# Patient Record
Sex: Female | Born: 1980 | Hispanic: No | Marital: Married | Smoking: Never smoker
Health system: Southern US, Community
[De-identification: ages and names within clinical notes are randomized; demographics above are authoritative.]

## PROBLEM LIST (undated history)

## (undated) ENCOUNTER — Inpatient Hospital Stay (HOSPITAL_COMMUNITY): Payer: Self-pay

## (undated) DIAGNOSIS — Z3490 Encounter for supervision of normal pregnancy, unspecified, unspecified trimester: Secondary | ICD-10-CM

## (undated) DIAGNOSIS — B999 Unspecified infectious disease: Secondary | ICD-10-CM

## (undated) DIAGNOSIS — N83209 Unspecified ovarian cyst, unspecified side: Secondary | ICD-10-CM

## (undated) HISTORY — PX: NO PAST SURGERIES: SHX2092

---

## 2007-09-12 ENCOUNTER — Other Ambulatory Visit: Admission: RE | Admit: 2007-09-12 | Discharge: 2007-09-12 | Payer: Self-pay | Admitting: Family Medicine

## 2008-09-22 ENCOUNTER — Emergency Department (HOSPITAL_COMMUNITY): Admission: EM | Admit: 2008-09-22 | Discharge: 2008-09-22 | Payer: Self-pay | Admitting: Emergency Medicine

## 2009-02-11 ENCOUNTER — Emergency Department (HOSPITAL_BASED_OUTPATIENT_CLINIC_OR_DEPARTMENT_OTHER): Admission: EM | Admit: 2009-02-11 | Discharge: 2009-02-11 | Payer: Self-pay | Admitting: Emergency Medicine

## 2011-01-10 LAB — COMPREHENSIVE METABOLIC PANEL
ALT: 8 U/L (ref 0–35)
Alkaline Phosphatase: 52 U/L (ref 39–117)
CO2: 26 mEq/L (ref 19–32)
Calcium: 8.4 mg/dL (ref 8.4–10.5)
GFR calc non Af Amer: >60 mL/min (ref >60)
Glucose, Bld: 167 mg/dL — ABNORMAL HIGH (ref 70–99)
Potassium: 3.9 mEq/L (ref 3.5–5.1)
Sodium: 142 mEq/L (ref 135–145)
Total Bilirubin: 0.4 mg/dL (ref 0.3–1.2)

## 2011-01-10 LAB — DIFFERENTIAL
Basophils Absolute: 0 10*3/uL (ref 0.0–0.1)
Basophils Relative: 0 % (ref 0–1)
Eosinophils Absolute: 0.1 10*3/uL (ref 0.0–0.7)
Neutrophils Relative %: 89 % — ABNORMAL HIGH (ref 43–77)

## 2011-01-10 LAB — URINALYSIS, ROUTINE W REFLEX MICROSCOPIC
Ketones, ur: 15 mg/dL — AB
Nitrite: NEGATIVE
Urobilinogen, UA: 1 mg/dL (ref 0.0–1.0)

## 2011-01-10 LAB — CBC
Hemoglobin: 11.1 g/dL — ABNORMAL LOW (ref 12.0–15.0)
MCHC: 33.8 g/dL (ref 30.0–36.0)
RBC: 3.7 MIL/uL — ABNORMAL LOW (ref 3.87–5.11)

## 2011-01-10 LAB — PREGNANCY, URINE: Preg Test, Ur: NEGATIVE

## 2011-01-10 LAB — LIPASE, BLOOD: Lipase: 41 U/L (ref 23–300)

## 2011-01-10 LAB — URINE MICROSCOPIC-ADD ON

## 2011-09-19 ENCOUNTER — Other Ambulatory Visit: Payer: Self-pay | Admitting: Family Medicine

## 2011-09-19 DIAGNOSIS — N63 Unspecified lump in unspecified breast: Secondary | ICD-10-CM

## 2011-09-19 DIAGNOSIS — N644 Mastodynia: Secondary | ICD-10-CM

## 2011-09-21 ENCOUNTER — Other Ambulatory Visit: Payer: Self-pay

## 2011-09-27 ENCOUNTER — Ambulatory Visit
Admission: RE | Admit: 2011-09-27 | Discharge: 2011-09-27 | Disposition: A | Discharge status: Home / Self Care | Payer: Self-pay | Source: Ambulatory Visit | Attending: Family Medicine | Admitting: Family Medicine

## 2011-09-27 DIAGNOSIS — N644 Mastodynia: Secondary | ICD-10-CM

## 2011-09-27 DIAGNOSIS — N63 Unspecified lump in unspecified breast: Secondary | ICD-10-CM

## 2013-09-20 ENCOUNTER — Inpatient Hospital Stay (HOSPITAL_BASED_OUTPATIENT_CLINIC_OR_DEPARTMENT_OTHER)
Admission: AD | Admit: 2013-09-20 | Discharge: 2013-09-20 | Disposition: A | Discharge status: Other Acute Inpatient Hospital | Payer: BC Managed Care – PPO | Source: Ambulatory Visit | Attending: Family Medicine | Admitting: Family Medicine

## 2013-09-20 ENCOUNTER — Inpatient Hospital Stay (HOSPITAL_COMMUNITY): Payer: BC Managed Care – PPO

## 2013-09-20 ENCOUNTER — Encounter (HOSPITAL_BASED_OUTPATIENT_CLINIC_OR_DEPARTMENT_OTHER): Payer: Self-pay | Admitting: Emergency Medicine

## 2013-09-20 DIAGNOSIS — O9989 Other specified diseases and conditions complicating pregnancy, childbirth and the puerperium: Secondary | ICD-10-CM | POA: Insufficient documentation

## 2013-09-20 DIAGNOSIS — R109 Unspecified abdominal pain: Secondary | ICD-10-CM | POA: Insufficient documentation

## 2013-09-20 DIAGNOSIS — O3680X1 Pregnancy with inconclusive fetal viability, fetus 1: Secondary | ICD-10-CM

## 2013-09-20 DIAGNOSIS — O200 Threatened abortion: Secondary | ICD-10-CM

## 2013-09-20 HISTORY — DX: Encounter for supervision of normal pregnancy, unspecified, unspecified trimester: Z34.90

## 2013-09-20 LAB — URINALYSIS, ROUTINE W REFLEX MICROSCOPIC
Glucose, UA: NEGATIVE mg/dL
Leukocytes, UA: NEGATIVE
Specific Gravity, Urine: 1.019 (ref 1.005–1.030)
Urobilinogen, UA: 0.2 mg/dL (ref 0.0–1.0)

## 2013-09-20 LAB — URINE MICROSCOPIC-ADD ON

## 2013-09-20 LAB — CBC
HCT: 40.8 % (ref 36.0–46.0)
Hemoglobin: 14.4 g/dL (ref 12.0–15.0)
MCV: 86.3 fL (ref 78.0–100.0)
RDW: 12.8 % (ref 11.5–15.5)
WBC: 9.6 10*3/uL (ref 4.0–10.5)

## 2013-09-20 LAB — WET PREP, GENITAL: Clue Cells Wet Prep HPF POC: NONE SEEN

## 2013-09-20 LAB — PREGNANCY, URINE: Preg Test, Ur: POSITIVE — AB

## 2013-09-20 NOTE — MAU Provider Note (Signed)
  History     CSN: [id number]  Arrival date and time: 09/20/13 1043   None     Chief Complaint  Patient presents with  . Vaginal Bleeding  . pregnant    HPI Melissa Peck is a 32 y.o. G2P1001 at [redacted]w[redacted]d presented to the ED for complaints of vaginal bleeding. Pt with positive pregnancy test and transved via POV to MAU for further evaluation. Pt states that over the last week she has had brown discharge similar to first pregnancy. Pt this monring started having Bright red blood from her vagina and vaginal cramping. Pt states that she has otherwise in her usual state of health.  +Cramping, +VB, no lof  OB History   Grav Para Term Preterm Abortions TAB SAB Ect Mult Living   2 1 1       1       Past Medical History  Diagnosis Date  . Pregnant     History reviewed. No pertinent past surgical history.  History reviewed. No pertinent family history.  History  Substance Use Topics  . Smoking status: Never Smoker   . Smokeless tobacco: Not on file  . Alcohol Use: No    Allergies: No Known Allergies  No prescriptions prior to admission    ROS No F/C, SOB, N/V, d/c, no headaches, Upper resp sx, or other complaints.  Physical Exam   Blood pressure 125/84, pulse 104, temperature 98.3 F (36.8 C), temperature source Oral, resp. rate 18, height 5\' 3"  (1.6 m), weight 51.256 kg (113 lb), last menstrual period 08/09/2013, SpO2 99.00%.  Physical Exam VSS NAD RRR no mgt Normal abdominal exam except for suprapubic tenderness.  No c/c/e  SSE: Signficant blood in vault with dark red clots and black clots.  Os with bloody discharge. Results for JENNESSA, TRIGO (MRN [id number]) as of 09/20/2013 16:55  Ref. Range 09/20/2013 12:30  hCG, Beta Chain, Quant, S Latest Range: <5 mIU/mL 2044 (H)  WBC Latest Range: 4.0-10.5 K/uL 9.6  RBC Latest Range: 3.87-5.11 MIL/uL 4.73  Hemoglobin Latest Range: 12.0-15.0 g/dL 30.8  HCT Latest Range: 36.0-46.0 % 40.8  MCV Latest Range: 78.0-100.0 fL 86.3   MCH Latest Range: 26.0-34.0 pg 30.4  MCHC Latest Range: 30.0-36.0 g/dL 65.7  RDW Latest Range: 11.5-15.5 % 12.8  Platelets Latest Range: 150-400 K/uL 186   IMPRESSION:  No evidence of intrauterine pregnancy. Heterogeneous material within  the lower uterine canal may represent clot. This may represent a  early or nonviable early pregnancy. Recommend follow-up quantitative  B-HCG levels and follow-up US in 14 days to confirm/assess  viability. This recommendation follows SRU consensus guidelines:  Diagnostic Criteria for Nonviable Pregnancy Early in the First  Trimester. Malva Limes Med 2013; 846:9629-52.  MAU Course  Procedures  MDM Will further eval with Transvaginal US to rule out ectopic  Assessment and Plan  Melissa Peck is a 32 y.o. G2P1001 at [redacted]w[redacted]d likely with incomplete miscarriage. Pt is actively bleeding and will need repeat quant in ~48 hrs. Pt is going out of town, given strict precautions for ER visit and recommend follow up here as soon as she returns for reevaluation. Labs and imaging report provided to patient. Pt states understanding.  Tawana Scale, MD OB Fellow   Cinque Begley, Audie Clear 09/20/2013, 4:52 PM

## 2013-09-20 NOTE — ED Provider Notes (Addendum)
CSN: [id number]     Arrival date & time 09/20/13  1043 History   First MD Initiated Contact with Patient 09/20/13 1154     Chief Complaint  Patient presents with  . Vaginal Bleeding  . pregnant    (Consider location/radiation/quality/duration/timing/severity/associated sxs/prior Treatment) HPI Comments: Patient presents with vaginal bleeding. Her last menstrual period was November 8. She woke up this morning with some lower abdominal cramping and bleeding. She's been passing some blood clots. She does have cough and cold symptoms have been going on for the last few days but denies any fevers. She denies any significant dizziness. She currently denies any abdominal pain. This is her second pregnancy. She has one child.  G2P1  Patient is a 32 y.o. female presenting with vaginal bleeding.  Vaginal Bleeding Associated symptoms: abdominal pain (cramping)   Associated symptoms: no back pain, no dizziness, no fatigue, no fever, no nausea and no vaginal discharge     Past Medical History  Diagnosis Date  . Pregnant    History reviewed. No pertinent past surgical history. No family history on file. History  Substance Use Topics  . Smoking status: Never Smoker   . Smokeless tobacco: Not on file  . Alcohol Use: Not on file   OB History   Grav Para Term Preterm Abortions TAB SAB Ect Mult Living   1              Review of Systems  Constitutional: Negative for fever, chills, diaphoresis and fatigue.  HENT: Negative for congestion, rhinorrhea and sneezing.   Eyes: Negative.   Respiratory: Negative for cough, chest tightness and shortness of breath.   Cardiovascular: Negative for chest pain and leg swelling.  Gastrointestinal: Positive for abdominal pain (cramping). Negative for nausea, vomiting, diarrhea and blood in stool.  Genitourinary: Positive for vaginal bleeding. Negative for frequency, hematuria, flank pain, vaginal discharge and difficulty urinating.  Musculoskeletal: Negative  for arthralgias and back pain.  Skin: Negative for rash.  Neurological: Negative for dizziness, speech difficulty, weakness, numbness and headaches.    Allergies  Review of patient's allergies indicates no known allergies.  Home Medications  No current outpatient prescriptions on file. BP 117/92  Pulse 93  Temp(Src) 98.4 F (36.9 C) (Oral)  Resp 20  Ht 5\' 3"  (1.6 m)  Wt 113 lb (51.256 kg)  BMI 20.02 kg/m2  SpO2 100%  LMP 08/09/2013 Physical Exam  Constitutional: She is oriented to person, place, and time. She appears well-developed and well-nourished.  HENT:  Head: Normocephalic and atraumatic.  Eyes: Pupils are equal, round, and reactive to light.  Neck: Normal range of motion. Neck supple.  Cardiovascular: Normal rate, regular rhythm and normal heart sounds.   Pulmonary/Chest: Effort normal and breath sounds normal. No respiratory distress. She has no wheezes. She has no rales. She exhibits no tenderness.  Abdominal: Soft. Bowel sounds are normal. There is no tenderness. There is no rebound and no guarding.  Genitourinary:  Moderate amount of dark blood/clots in vault.  Cervix closed  Musculoskeletal: Normal range of motion. She exhibits no edema.  Lymphadenopathy:    She has no cervical adenopathy.  Neurological: She is alert and oriented to person, place, and time.  Skin: Skin is warm and dry. No rash noted.  Psychiatric: She has a normal mood and affect.    ED Course  Procedures (including critical care time) Labs Review Labs Reviewed  URINALYSIS, ROUTINE W REFLEX MICROSCOPIC - Abnormal; Notable for the following:    Hgb urine  dipstick LARGE (*)    All other components within normal limits  PREGNANCY, URINE - Abnormal; Notable for the following:    Preg Test, Ur POSITIVE (*)    All other components within normal limits  URINE MICROSCOPIC-ADD ON - Abnormal; Notable for the following:    Bacteria, UA FEW (*)    All other components within normal limits  HCG,  QUANTITATIVE, PREGNANCY - Abnormal; Notable for the following:    hCG, Beta Chain, Quant, S 2044 (*)    All other components within normal limits  CBC  ABO/RH   Imaging Review No results found.  EKG Interpretation   None       MDM   1. Threatened abortion    Patient with bleeding early in pregnancy. She had a moderate amount of blood on speculum exam. Her hemoglobin is stable. I spoke with Dr. Shawnie Pons who is on call for OB/GYN and she has accepted the patient for transfer to the MAU at the [facility] hospital for ultrasound.    Rolan Bucco, MD 09/20/13 1329  Rolan Bucco, MD 10/01/13 [phone]

## 2013-09-20 NOTE — ED Notes (Addendum)
Patient reports that she awoke this am with initially brownish vaginal bleeding. As the morning has progressed the bleeding has become bright red with clots and abdominal cramping. Has 1st U/S scheduled in mid January.

## 2013-09-20 NOTE — MAU Provider Note (Signed)
Chart reviewed and agree with management and plan.  

## 2013-09-27 ENCOUNTER — Inpatient Hospital Stay (HOSPITAL_COMMUNITY)
Admission: AD | Admit: 2013-09-27 | Discharge: 2013-09-27 | Disposition: A | Discharge status: Home / Self Care | Payer: BC Managed Care – PPO | Source: Ambulatory Visit | Attending: Obstetrics & Gynecology | Admitting: Obstetrics & Gynecology

## 2013-09-27 DIAGNOSIS — O039 Complete or unspecified spontaneous abortion without complication: Secondary | ICD-10-CM | POA: Insufficient documentation

## 2013-09-27 LAB — HCG, QUANTITATIVE, PREGNANCY: hCG, Beta Chain, Quant, S: 40 m[IU]/mL — ABNORMAL HIGH (ref <5)

## 2013-09-27 NOTE — MAU Provider Note (Signed)
HPI: Ms. Melissa Peck is a 32 y.o. female here for a follow up beta hcg level. She was unable to make it to her 48 hour quant follow up. She at the time had a good idea this pregnancy was going to be a miscarriage due to the amount of bleeding she was having. She denies pain at this time. She continues to have light vaginal bleeding.  Beta quant on 12/20= 2044.   Objective:  GENERAL: Well-developed, well-nourished female in no acute distress.  HEENT: Normocephalic, atraumatic.   LUNGS: Effort normal HEART: Regular rate  SKIN: Warm, dry and without erythema PSYCH: Normal mood and affect  Filed Vitals:   09/27/13 1046  BP: 119/72  Pulse: 73  Temp: 98 F (36.7 C)  Resp: 18    Results for orders placed during the hospital encounter of 09/27/13 (from the past 24 hour(s))  HCG, QUANTITATIVE, PREGNANCY     Status: Abnormal   Collection Time    09/27/13 11:08 AM      Result Value Range   hCG, Beta Chain, Quant, S 40 (*) <5 mIU/mL    A: SAB  P: Discharge home in stable condition  Follow up in the clinic in 1 week; message sent  Bleeding precautions discussed Return to MAU as needed, if symptoms worsen  Iona Hansen Rasch, NP 09/27/2013 2:18 PM

## 2013-09-27 NOTE — MAU Provider Note (Signed)
Attestation of Attending Supervision of Advanced Practitioner (CNM/NP): Evaluation and management procedures were performed by the Advanced Practitioner under my supervision and collaboration. I have reviewed the Advanced Practitioner's note and chart, and I agree with the management and plan.  Blondell Laperle H. 2:50 PM   

## 2013-09-27 NOTE — MAU Note (Addendum)
Pt states here today for follow up of vaginal bleeding. Wants to check hormone level/repeat u/s to confirm miscarriage. States bleeding has slowed down today. Has had bleeding since 09/20/2013 when she was seen here last. Wearing panty liner today, however had been changing pad q1.5 hours.

## 2013-10-09 ENCOUNTER — Encounter: Payer: BC Managed Care – PPO | Admitting: Obstetrics & Gynecology

## 2013-10-31 ENCOUNTER — Encounter: Payer: BC Managed Care – PPO | Admitting: Nurse Practitioner

## 2013-11-12 ENCOUNTER — Encounter: Payer: Self-pay | Admitting: Medical

## 2013-11-12 ENCOUNTER — Ambulatory Visit (INDEPENDENT_AMBULATORY_CARE_PROVIDER_SITE_OTHER): Payer: BC Managed Care – PPO | Admitting: Medical

## 2013-11-12 VITALS — BP 117/77 | HR 70 | Temp 97.7°F | Resp 20 | Ht 62.0 in | Wt 117.0 lb

## 2013-11-12 DIAGNOSIS — O039 Complete or unspecified spontaneous abortion without complication: Secondary | ICD-10-CM

## 2013-11-12 LAB — CBC
HCT: 41.5 % (ref 36.0–46.0)
Hemoglobin: 14.4 g/dL (ref 12.0–15.0)
MCH: 30.1 pg (ref 26.0–34.0)
MCHC: 34.7 g/dL (ref 30.0–36.0)
MCV: 86.6 fL (ref 78.0–100.0)
PLATELETS: 219 10*3/uL (ref 150–400)
RBC: 4.79 MIL/uL (ref 3.87–5.11)
RDW: 13.6 % (ref 11.5–15.5)
WBC: 6.1 10*3/uL (ref 4.0–10.5)

## 2013-11-12 MED ORDER — PRENATAL VITAMINS 0.8 MG PO TABS: 1.0000 | ORAL_TABLET | Freq: Every day | ORAL | Status: AC

## 2013-11-12 NOTE — Patient Instructions (Signed)
Miscarriage A miscarriage is the sudden loss of an unborn baby (fetus) before the 20th week of pregnancy. Most miscarriages happen in the first 3 months of pregnancy. Sometimes, it happens before a woman even knows she is pregnant. A miscarriage is also called a "spontaneous miscarriage" or "early pregnancy loss." Having a miscarriage can be an emotional experience. Talk with your caregiver about any questions you may have about miscarrying, the grieving process, and your future pregnancy plans. CAUSES   Problems with the fetal chromosomes that make it impossible for the baby to develop normally. Problems with the baby's genes or chromosomes are most often the result of errors that occur, by chance, as the embryo divides and grows. The problems are not inherited from the parents.  Infection of the cervix or uterus.   Hormone problems.   Problems with the cervix, such as having an incompetent cervix. This is when the tissue in the cervix is not strong enough to hold the pregnancy.   Problems with the uterus, such as an abnormally shaped uterus, uterine fibroids, or congenital abnormalities.   Certain medical conditions.   Smoking, drinking alcohol, or taking illegal drugs.   Trauma.  Often, the cause of a miscarriage is unknown.  SYMPTOMS   Vaginal bleeding or spotting, with or without cramps or pain.  Pain or cramping in the abdomen or lower back.  Passing fluid, tissue, or blood clots from the vagina. DIAGNOSIS  Your caregiver will perform a physical exam. You may also have an ultrasound to confirm the miscarriage. Blood or urine tests may also be ordered. TREATMENT   Sometimes, treatment is not necessary if you naturally pass all the fetal tissue that was in the uterus. If some of the fetus or placenta remains in the body (incomplete miscarriage), tissue left behind may become infected and must be removed. Usually, a dilation and curettage (D and C) procedure is performed.  During a D and C procedure, the cervix is widened (dilated) and any remaining fetal or placental tissue is gently removed from the uterus.  Antibiotic medicines are prescribed if there is an infection. Other medicines may be given to reduce the size of the uterus (contract) if there is a lot of bleeding.  If you have Rh negative blood and your baby was Rh positive, you will need a Rh immunoglobulin shot. This shot will protect any future baby from having Rh blood problems in future pregnancies. HOME CARE INSTRUCTIONS   Your caregiver may order bed rest or may allow you to continue light activity. Resume activity as directed by your caregiver.  Have someone help with home and family responsibilities during this time.   Keep track of the number of sanitary pads you use each day and how soaked (saturated) they are. Write down this information.   Do not use tampons. Do not douche or have sexual intercourse until approved by your caregiver.   Only take over-the-counter or prescription medicines for pain or discomfort as directed by your caregiver.   Do not take aspirin. Aspirin can cause bleeding.   Keep all follow-up appointments with your caregiver.   If you or your partner have problems with grieving, talk to your caregiver or seek counseling to help cope with the pregnancy loss. Allow enough time to grieve before trying to get pregnant again.  SEEK IMMEDIATE MEDICAL CARE IF:   You have severe cramps or pain in your back or abdomen.  You have a fever.  You pass large blood clots (walnut-sized   or larger) ortissue from your vagina. Save any tissue for your caregiver to inspect.   Your bleeding increases.   You have a thick, bad-smelling vaginal discharge.  You become lightheaded, weak, or you faint.   You have chills.  MAKE SURE YOU:  Understand these instructions.  Will watch your condition.  Will get help right away if you are not doing well or get  worse. Document Released: 03/14/2001 Document Revised: 01/13/2013 Document Reviewed: 11/07/2011 Exit[employer] Patient Information 2014 [employer].  

## 2013-11-12 NOTE — Progress Notes (Signed)
Pt wants to become pregnant very soon.  Pt has questions regarding permissible physical training.

## 2013-11-12 NOTE — Progress Notes (Signed)
Patient ID: Melissa FrancoAylin Peck, female   DOB: May 22, 1981, 33 y.o.   MRN: [id number]  History:  Ms. Melissa Peck is a 33 y.o. G2P1001 who presents to clinic today for follow-up after SAB on 10/28/13. The patient states that she had bleeding x 1 week after SAB. She has had 2 normal periods since then although they are a little bit heavier than prior to her pregnancy. She denies any pain or fever today. She has had occasional hot flashes. She does desire pregnancy and plans to use condoms until her and her husband want to start trying again in the next few months. She has brought a list of supplements from her trainer to confirm that they are safe in pregnancy.    The following portions of the patient's history were reviewed and updated as appropriate: allergies, current medications, past family history, past medical history, past social history, past surgical history and problem list.  Review of Systems:  Pertinent items are noted in HPI.  Objective:  Physical Exam BP 117/77  Pulse 70  Temp(Src) 97.7 F (36.5 C) (Oral)  Resp 20  Ht 5\' 2"  (1.575 m)  Wt 117 lb (53.071 kg)  BMI 21.39 kg/m2  LMP 11/11/2013  Breastfeeding? Unknown GENERAL: Well-developed, well-nourished female in no acute distress.  HEENT: Normocephalic, atraumatic.   LUNGS: Effort normal HEART: Regular rate  SKIN: Warm, dry and without erythema PSYCH: Normal mood and affect  Labs and Imaging Quant hCG and CBC today  Assessment & Plan:  Assessment: Recent SAB  Plans: Quant hCG and CBC today Patient will be informed of results Rx for prenatal vitamins sent to patient's pharmacy [facility] PRN  Freddi StarrJulie N Ethier, PA-C 11/12/2013 2:25 PM

## 2013-11-13 LAB — HCG, QUANTITATIVE, PREGNANCY: hCG, Beta Chain, Quant, S: <2 m[IU]/mL

## 2013-11-17 ENCOUNTER — Telehealth: Payer: Self-pay

## 2013-11-17 NOTE — Telephone Encounter (Signed)
Message copied by Louanna RawAMPBELL, Navarro Nine M on Mon Nov 17, 2013 10:29 AM ------      Message from: Freddi StarrETHIER, JULIE N      Created: Thu Nov 13, 2013  8:43 PM       Please inform patient that quant hCG has returned to normal. She should continue to use condoms until she is ready to start trying for her next pregnancy. Thanks!       ------

## 2013-11-17 NOTE — Telephone Encounter (Signed)
Called pt. No answer. Left message stating we are calling with results, please call clinic at your convenience.  

## 2013-11-18 NOTE — Telephone Encounter (Signed)
Called pt and informed her that her recent lab test has been reviewed by Joseph BerkshireJulie Ethier PA and I am calling with her recommendations. The pregnancy hormone level has returned to normal. She should continue to use condoms until she is ready to try for another pregnancy. It is recommended that she wait atleast 3 months before another pregnancy to allow her body to completely return to normal. Pt voiced understanding.

## 2014-06-08 ENCOUNTER — Inpatient Hospital Stay (HOSPITAL_COMMUNITY)
Admission: AD | Admit: 2014-06-08 | Discharge: 2014-06-08 | Disposition: A | Discharge status: Home / Self Care | Payer: BC Managed Care – PPO | Source: Ambulatory Visit | Attending: Obstetrics & Gynecology | Admitting: Obstetrics & Gynecology

## 2014-06-08 ENCOUNTER — Encounter (HOSPITAL_COMMUNITY): Payer: Self-pay

## 2014-06-08 DIAGNOSIS — Z3492 Encounter for supervision of normal pregnancy, unspecified, second trimester: Secondary | ICD-10-CM

## 2014-06-08 DIAGNOSIS — O2690 Pregnancy related conditions, unspecified, unspecified trimester: Secondary | ICD-10-CM

## 2014-06-08 DIAGNOSIS — N898 Other specified noninflammatory disorders of vagina: Secondary | ICD-10-CM | POA: Diagnosis not present

## 2014-06-08 DIAGNOSIS — O99891 Other specified diseases and conditions complicating pregnancy: Secondary | ICD-10-CM | POA: Diagnosis not present

## 2014-06-08 DIAGNOSIS — O9989 Other specified diseases and conditions complicating pregnancy, childbirth and the puerperium: Principal | ICD-10-CM

## 2014-06-08 HISTORY — DX: Unspecified ovarian cyst, unspecified side: N83.209

## 2014-06-08 HISTORY — DX: Unspecified infectious disease: B99.9

## 2014-06-08 LAB — URINALYSIS, ROUTINE W REFLEX MICROSCOPIC
Bilirubin Urine: NEGATIVE
GLUCOSE, UA: NEGATIVE mg/dL
Hgb urine dipstick: NEGATIVE
KETONES UR: NEGATIVE mg/dL
LEUKOCYTES UA: NEGATIVE
NITRITE: NEGATIVE
PROTEIN: NEGATIVE mg/dL
Specific Gravity, Urine: 1.01 (ref 1.005–1.030)
Urobilinogen, UA: 0.2 mg/dL (ref 0.0–1.0)
pH: 7.5 (ref 5.0–8.0)

## 2014-06-08 LAB — WET PREP, GENITAL
Clue Cells Wet Prep HPF POC: NONE SEEN
Trich, Wet Prep: NONE SEEN
Yeast Wet Prep HPF POC: NONE SEEN

## 2014-06-08 NOTE — MAU Note (Signed)
Patient states she has been getting her care in [facility]. States she has had a vaginal discharge for 2 days. Slight pain off and on. Not wearing a pad on arrival to MAU.

## 2014-06-08 NOTE — MAU Provider Note (Signed)
History     CSN: [id number]  Arrival date and time: 06/08/14 4098   First Provider Initiated Contact with Patient 06/08/14 [phone]      Chief Complaint  Patient presents with  . Vaginal Discharge   HPI  Pt is a G3P1011 at [redacted]w[redacted]d wks IUP receiving her care in [facility] here with report of white, grayish discharge x 2-3 days.  No report of vaginal itching or odor.  Denies vaginal bleeding or cramping.  Concerned because of the previous miscarriage.  No problems during current pregnancy.    Past Medical History  Diagnosis Date  . Pregnant   . Infection     UTI  . Ovarian cyst     Past Surgical History  Procedure Laterality Date  . No past surgeries      Family History  Problem Relation Age of Onset  . Diabetes Mother   . Hypotension Mother   . Hyperlipidemia Father     History  Substance Use Topics  . Smoking status: Never Smoker   . Smokeless tobacco: Never Used  . Alcohol Use: No     Comment: socially    Allergies: No Known Allergies  Prescriptions prior to admission  Medication Sig Dispense Refill  . Prenatal Multivit-Min-Fe-FA (PRENATAL VITAMINS) 0.8 MG tablet Take 1 tablet by mouth daily.  30 tablet  11    Review of Systems  Gastrointestinal: Negative for abdominal pain.  Genitourinary: Negative.        +vaginal discharge   Physical Exam   Blood pressure 111/63, pulse 83, temperature 98.7 F (37.1 C), temperature source Oral, resp. rate 16, height  (1.575 m), weight 59.966 kg (132 lb 3.2 oz), unknown if currently breastfeeding.  Physical Exam  Constitutional: She is oriented to person, place, and time. She appears well-developed and well-nourished.  HENT:  Head: Normocephalic.  Neck: Normal range of motion. Neck supple.  Cardiovascular: Normal rate, regular rhythm and normal heart sounds.   Respiratory: Effort normal and breath sounds normal.  GI: Soft. There is no tenderness.  Genitourinary: No bleeding around the vagina. Vaginal discharge  (mucusy) found.  Neurological: She is alert and oriented to person, place, and time.  Skin: Skin is warm and dry.    MAU Course  Procedures Results for orders placed during the hospital encounter of 06/08/14 (from the past 24 hour(s))  URINALYSIS, ROUTINE W REFLEX MICROSCOPIC     Status: None   Collection Time    06/08/14  7:05 AM      Result Value Ref Range   Color, Urine YELLOW  YELLOW   APPearance CLEAR  CLEAR   Specific Gravity, Urine 1.010  1.005 - 1.030   pH 7.5  5.0 - 8.0   Glucose, UA NEGATIVE  NEGATIVE mg/dL   Hgb urine dipstick NEGATIVE  NEGATIVE   Bilirubin Urine NEGATIVE  NEGATIVE   Ketones, ur NEGATIVE  NEGATIVE mg/dL   Protein, ur NEGATIVE  NEGATIVE mg/dL   Urobilinogen, UA 0.2  0.0 - 1.0 mg/dL   Nitrite NEGATIVE  NEGATIVE   Leukocytes, UA NEGATIVE  NEGATIVE  WET PREP, GENITAL     Status: Abnormal   Collection Time    06/08/14  7:40 AM      Result Value Ref Range   Yeast Wet Prep HPF POC NONE SEEN  NONE SEEN   Trich, Wet Prep NONE SEEN  NONE SEEN   Clue Cells Wet Prep HPF POC NONE SEEN  NONE SEEN   WBC, Wet Prep HPF  POC FEW (*) NONE SEEN     Assessment and Plan  33 yo G3P1011 at [redacted]w[redacted]d wks IUP Reassuring Fetal Heart Tones  Plan: Discharge to home Provided reassurance to patient Keep scheduled appointment.  Rochele Pages N 06/08/2014, 7:38 AM

## 2014-06-08 NOTE — MAU Note (Signed)
No bleeding now, d/c is grayish and big chunks. No pain.  Wanted to be cautious after having SAB in Dec (5wK)

## 2014-06-09 NOTE — MAU Provider Note (Signed)
Attestation of Attending Supervision of Advanced Practitioner (CNM/NP): Evaluation and management procedures were performed by the Advanced Practitioner under my supervision and collaboration. I have reviewed the Advanced Practitioner's note and chart, and I agree with the management and plan.  LEGGETT,KELLY H. 9:40 AM

## 2014-08-03 ENCOUNTER — Encounter (HOSPITAL_COMMUNITY): Payer: Self-pay

## 2015-02-17 IMAGING — US US OB COMP LESS 14 WK
1 series · 13 of 28 positions shown · non-contrast
Comparison: None.

CLINICAL DATA: 32-year-old pregnant female with bleeding. Estimated
gestational age of 6 weeks 0 days by LMP.

EXAM:
OBSTETRIC <14 WK US AND TRANSVAGINAL OB US
TECHNIQUE: Both transabdominal and transvaginal ultrasound examinations were
performed for complete evaluation of the gestation as well as the
maternal uterus, adnexal regions, and pelvic cul-de-sac.
Transvaginal technique was performed to assess early pregnancy.

[Series 1: us ob comp less 14 wks · 49 acquisitions, 13 frames shown]
[im 2/49]
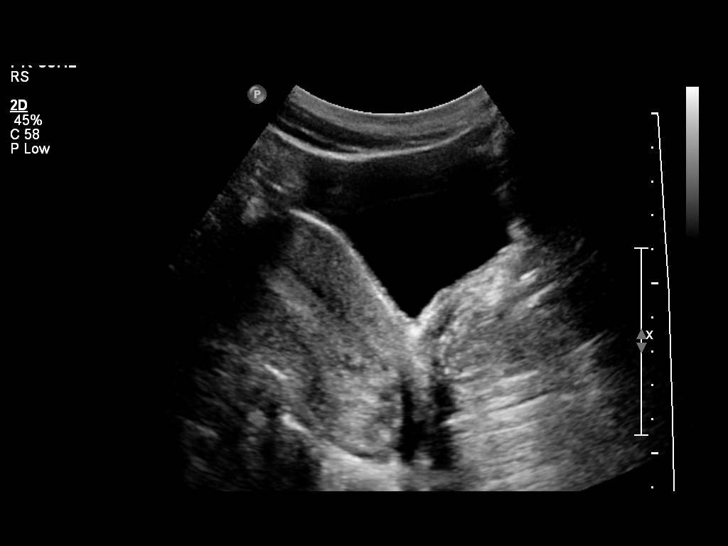
[im 6/49]
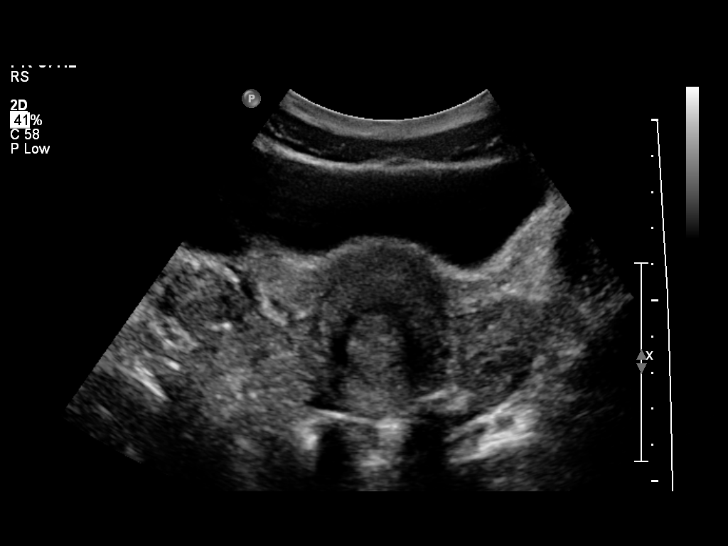
[im 9/49]
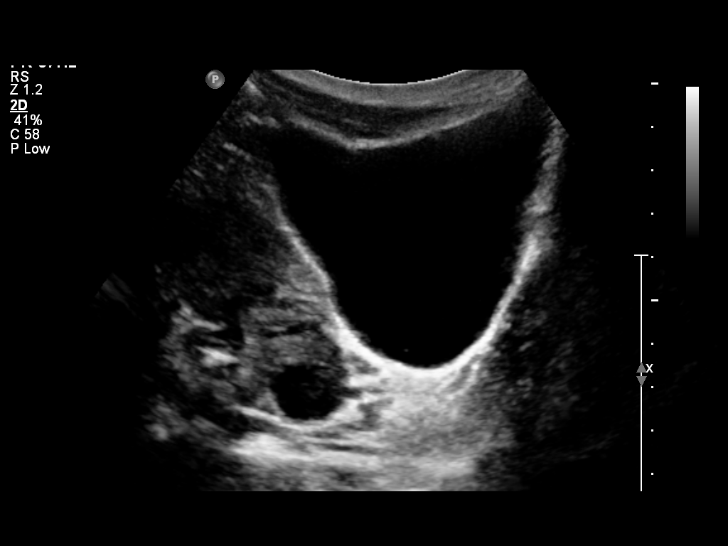
[im 13/49]
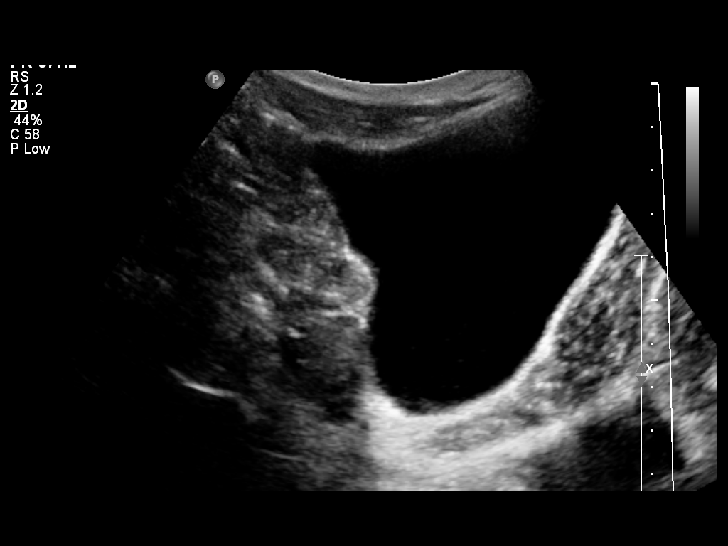
[im 17/49]
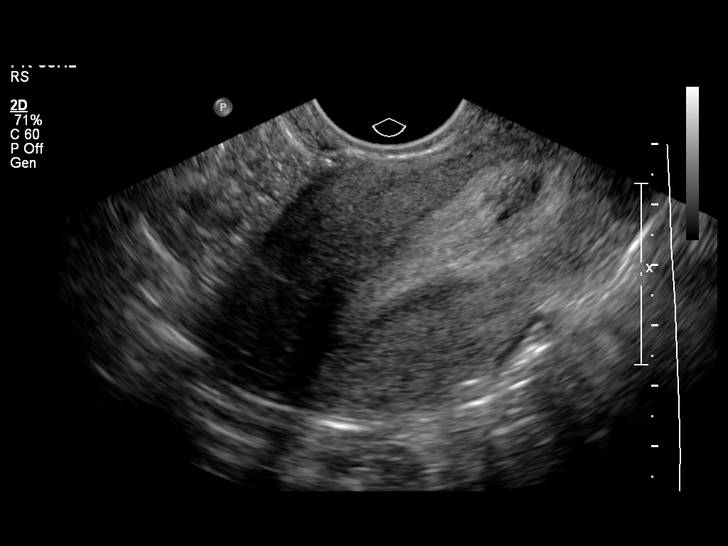
[im 20/49]
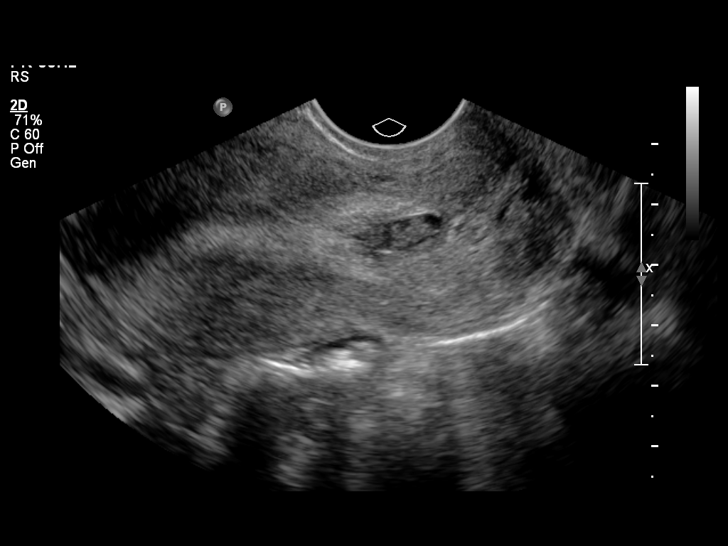
[im 25/49]
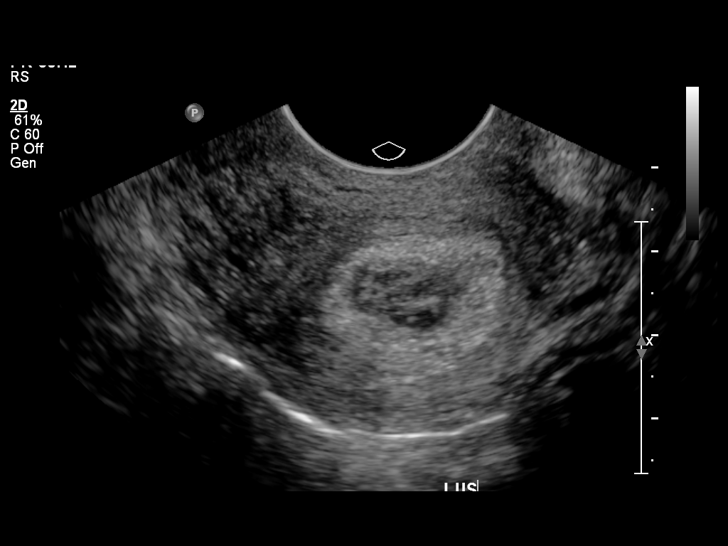
[im 29/49]
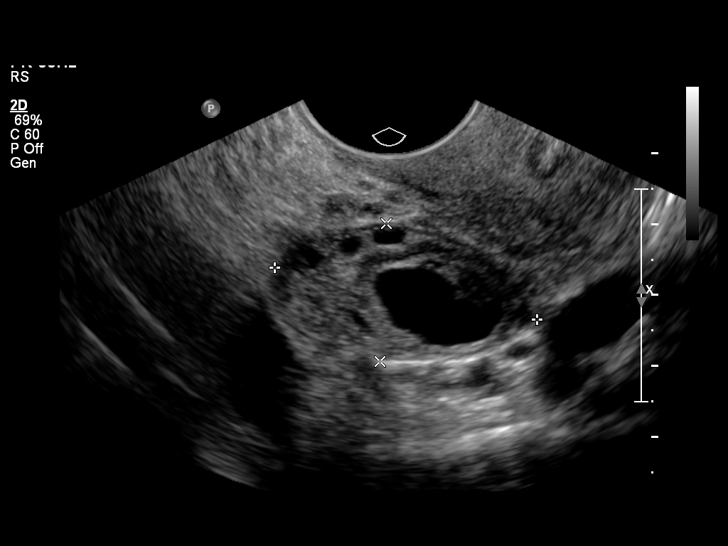
[im 33/49]
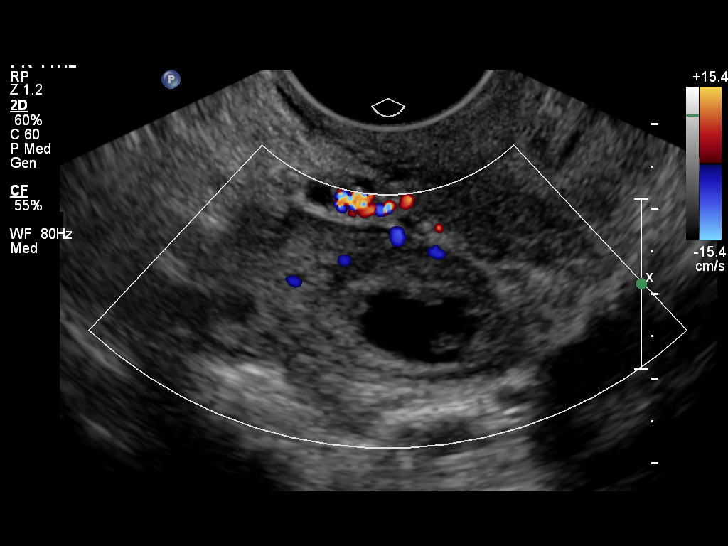
[im 36/49]
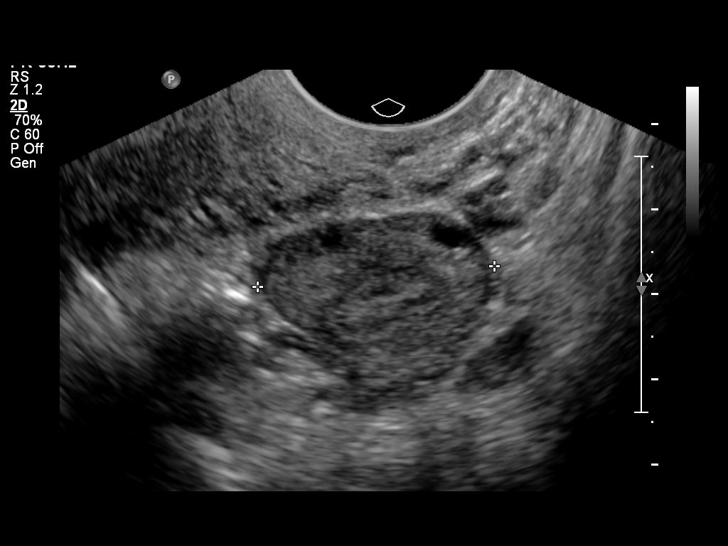
[im 40/49]
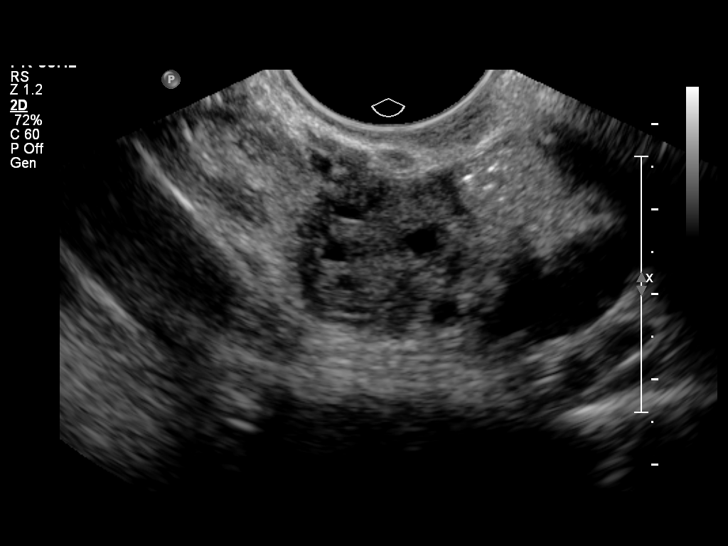
[im 43/49]
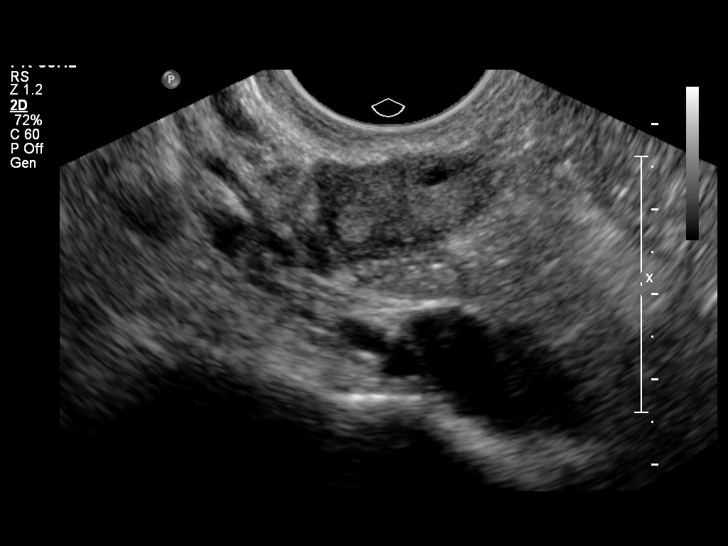
[im 47/49]
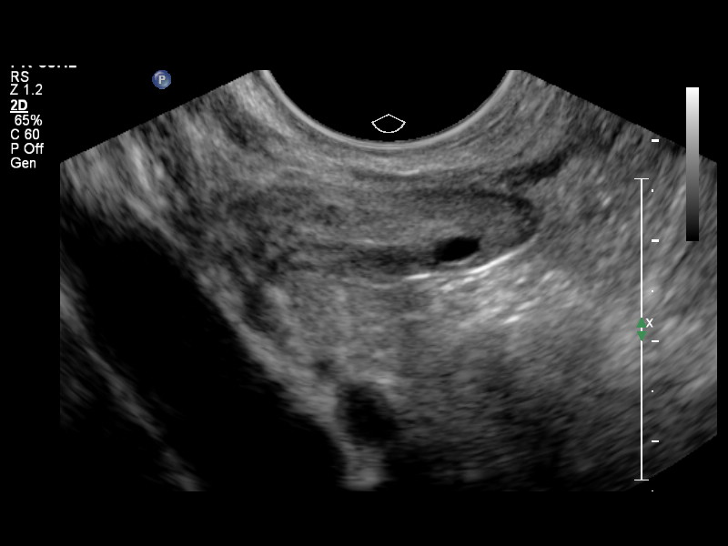

[13 of 28 positions shown; findings below may reference images not displayed]

FINDINGS: Intrauterine gestational sac: Not visualized.

Yolk sac:  Not visualized.

Embryo:  Not visualized.

Cardiac Activity: Not visualized.

Heterogeneous material within the lower uterine canal is present.

Maternal uterus/adnexae: The ovaries bilaterally are unremarkable.

There is no evidence of free fluid or adnexal mass.
IMPRESSION: No evidence of intrauterine pregnancy. Heterogeneous material within
the lower uterine canal may represent clot. This may represent a
early or nonviable early pregnancy. Recommend follow-up quantitative
B-HCG levels and follow-up US in 14 days to confirm/assess
viability. This recommendation follows SRU consensus guidelines:
Diagnostic Criteria for Nonviable Pregnancy Early in the First
Trimester. N Engl J Med 9564; [DATE].

## 2015-04-13 ENCOUNTER — Encounter (HOSPITAL_COMMUNITY): Payer: Self-pay | Admitting: *Deleted
